# Patient Record
Sex: Female | Born: 2000 | Hispanic: Yes | Marital: Single | Smoking: Never smoker
Health system: Southern US, Community
[De-identification: ages and names within clinical notes are randomized; demographics above are authoritative.]

---
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
## 2021-02-08 ENCOUNTER — Emergency Department: Payer: BLUE CROSS/BLUE SHIELD

## 2021-02-08 ENCOUNTER — Observation Stay
Admission: EM | Admit: 2021-02-08 | Discharge: 2021-02-09 | Disposition: A | Discharge status: Home / Self Care | Payer: BLUE CROSS/BLUE SHIELD | Attending: Student in an Organized Health Care Education/Training Program | Admitting: Student in an Organized Health Care Education/Training Program

## 2021-02-08 ENCOUNTER — Other Ambulatory Visit: Payer: Self-pay

## 2021-02-08 ENCOUNTER — Emergency Department: Payer: BLUE CROSS/BLUE SHIELD | Admitting: Anesthesiology

## 2021-02-08 ENCOUNTER — Encounter
Admission: EM | Disposition: A | Discharge status: Home / Self Care | Payer: Self-pay | Source: Home / Self Care | Attending: Student in an Organized Health Care Education/Training Program

## 2021-02-08 DIAGNOSIS — R1031 Right lower quadrant pain: Secondary | ICD-10-CM

## 2021-02-08 DIAGNOSIS — K3580 Unspecified acute appendicitis: Secondary | ICD-10-CM | POA: Diagnosis not present

## 2021-02-08 DIAGNOSIS — Z20822 Contact with and (suspected) exposure to covid-19: Secondary | ICD-10-CM | POA: Diagnosis not present

## 2021-02-08 DIAGNOSIS — K37 Unspecified appendicitis: Secondary | ICD-10-CM | POA: Diagnosis present

## 2021-02-08 HISTORY — PX: LAPAROSCOPIC APPENDECTOMY: SHX408

## 2021-02-08 LAB — COMPREHENSIVE METABOLIC PANEL
ALT: 13 U/L (ref 0–44)
AST: 16 U/L (ref 15–41)
Albumin: 3.8 g/dL (ref 3.5–5.0)
Alkaline Phosphatase: 63 U/L (ref 38–126)
Anion gap: 7 (ref 5–15)
BUN: 11 mg/dL (ref 6–20)
CO2: 25 mmol/L (ref 22–32)
Calcium: 9.1 mg/dL (ref 8.9–10.3)
Chloride: 105 mmol/L (ref 98–111)
Creatinine, Ser: 0.59 mg/dL (ref 0.44–1.00)
GFR, Estimated: >60 mL/min (ref >60)
Glucose, Bld: 82 mg/dL (ref 70–99)
Potassium: 3.7 mmol/L (ref 3.5–5.1)
Sodium: 137 mmol/L (ref 135–145)
Total Bilirubin: 0.4 mg/dL (ref 0.3–1.2)
Total Protein: 7.3 g/dL (ref 6.5–8.1)

## 2021-02-08 LAB — CBC WITH DIFFERENTIAL/PLATELET
Abs Immature Granulocytes: 0.03 10*3/uL (ref 0.00–0.07)
Basophils Absolute: 0.1 10*3/uL (ref 0.0–0.1)
Basophils Relative: 1 %
Eosinophils Absolute: 0.1 10*3/uL (ref 0.0–0.5)
Eosinophils Relative: 1 %
HCT: 37.9 % (ref 36.0–46.0)
Hemoglobin: 12.2 g/dL (ref 12.0–15.0)
Immature Granulocytes: 0 %
Lymphocytes Relative: 34 %
Lymphs Abs: 3.2 10*3/uL (ref 0.7–4.0)
MCH: 29.6 pg (ref 26.0–34.0)
MCHC: 32.2 g/dL (ref 30.0–36.0)
MCV: 92 fL (ref 80.0–100.0)
Monocytes Absolute: 0.5 10*3/uL (ref 0.1–1.0)
Monocytes Relative: 5 %
Neutro Abs: 5.5 10*3/uL (ref 1.7–7.7)
Neutrophils Relative %: 59 %
Platelets: 265 10*3/uL (ref 150–400)
RBC: 4.12 MIL/uL (ref 3.87–5.11)
RDW: 12.3 % (ref 11.5–15.5)
WBC: 9.3 10*3/uL (ref 4.0–10.5)
nRBC: 0 % (ref 0.0–0.2)

## 2021-02-08 LAB — URINALYSIS, ROUTINE W REFLEX MICROSCOPIC
Bilirubin Urine: NEGATIVE
Glucose, UA: NEGATIVE mg/dL
Ketones, ur: 5 mg/dL — AB
Nitrite: NEGATIVE
Protein, ur: NEGATIVE mg/dL
Specific Gravity, Urine: 1.024 (ref 1.005–1.030)
pH: 5 (ref 5.0–8.0)

## 2021-02-08 LAB — RESP PANEL BY RT-PCR (FLU A&B, COVID) ARPGX2
Influenza A by PCR: NEGATIVE
Influenza B by PCR: NEGATIVE
SARS Coronavirus 2 by RT PCR: NEGATIVE

## 2021-02-08 LAB — LIPASE, BLOOD: Lipase: 28 U/L (ref 11–51)

## 2021-02-08 LAB — PREGNANCY, URINE: Preg Test, Ur: NEGATIVE

## 2021-02-08 SURGERY — APPENDECTOMY, LAPAROSCOPIC
Anesthesia: General | Site: Abdomen

## 2021-02-08 MED ORDER — METRONIDAZOLE 500 MG/100ML IV SOLN
500.0000 mg | Freq: Three times a day (TID) | INTRAVENOUS | Status: DC
  Administered 2021-02-08 – 2021-02-09 (×2): 500 mg via INTRAVENOUS
  Filled 2021-02-08 (×4): qty 100

## 2021-02-08 MED ORDER — MORPHINE SULFATE (PF) 4 MG/ML IV SOLN: 4.0000 mg | INTRAVENOUS | Status: DC | PRN

## 2021-02-08 MED ORDER — PROMETHAZINE HCL 25 MG/ML IJ SOLN: 6.2500 mg | INTRAMUSCULAR | Status: DC | PRN

## 2021-02-08 MED ORDER — ONDANSETRON HCL 4 MG/2ML IJ SOLN
INTRAMUSCULAR | Status: DC | PRN
  Administered 2021-02-08: 4 mg via INTRAVENOUS

## 2021-02-08 MED ORDER — PROCHLORPERAZINE MALEATE 10 MG PO TABS
10.0000 mg | ORAL_TABLET | Freq: Four times a day (QID) | ORAL | Status: DC | PRN
  Filled 2021-02-08: qty 1

## 2021-02-08 MED ORDER — FENTANYL CITRATE (PF) 100 MCG/2ML IJ SOLN: 25.0000 ug | INTRAMUSCULAR | Status: DC | PRN

## 2021-02-08 MED ORDER — SUGAMMADEX SODIUM 200 MG/2ML IV SOLN
INTRAVENOUS | Status: DC | PRN
Start: 2021-02-08 — End: 2021-02-08
  Administered 2021-02-08: 200 mg via INTRAVENOUS

## 2021-02-08 MED ORDER — PANTOPRAZOLE SODIUM 40 MG IV SOLR
40.0000 mg | Freq: Every day | INTRAVENOUS | Status: DC
  Administered 2021-02-08: 40 mg via INTRAVENOUS
  Filled 2021-02-08: qty 40

## 2021-02-08 MED ORDER — PHENYLEPHRINE HCL (PRESSORS) 10 MG/ML IV SOLN
INTRAVENOUS | Status: DC | PRN
  Administered 2021-02-08 (×2): 100 ug via INTRAVENOUS
  Administered 2021-02-08: 200 ug via INTRAVENOUS

## 2021-02-08 MED ORDER — ONDANSETRON 4 MG PO TBDP: 4.0000 mg | ORAL_TABLET | Freq: Four times a day (QID) | ORAL | Status: DC | PRN

## 2021-02-08 MED ORDER — ACETAMINOPHEN 10 MG/ML IV SOLN: 1000.0000 mg | Freq: Once | INTRAVENOUS | Status: DC | PRN

## 2021-02-08 MED ORDER — ONDANSETRON HCL 4 MG/2ML IJ SOLN: 4.0000 mg | Freq: Four times a day (QID) | INTRAMUSCULAR | Status: DC | PRN

## 2021-02-08 MED ORDER — PROPOFOL 10 MG/ML IV BOLUS
INTRAVENOUS | Status: AC
  Filled 2021-02-08: qty 20

## 2021-02-08 MED ORDER — SODIUM CHLORIDE 0.9 % IV SOLN: INTRAVENOUS | Status: DC | PRN

## 2021-02-08 MED ORDER — OXYCODONE HCL 5 MG PO TABS: 5.0000 mg | ORAL_TABLET | Freq: Once | ORAL | Status: DC | PRN

## 2021-02-08 MED ORDER — DROPERIDOL 2.5 MG/ML IJ SOLN
0.6250 mg | Freq: Once | INTRAMUSCULAR | Status: DC | PRN
  Filled 2021-02-08: qty 2

## 2021-02-08 MED ORDER — SODIUM CHLORIDE 0.9 % IV SOLN
1.0000 g | Freq: Once | INTRAVENOUS | Status: AC
  Administered 2021-02-08: 1 g via INTRAVENOUS
  Filled 2021-02-08: qty 10

## 2021-02-08 MED ORDER — ROCURONIUM BROMIDE 100 MG/10ML IV SOLN
INTRAVENOUS | Status: DC | PRN
Start: 2021-02-08 — End: 2021-02-08
  Administered 2021-02-08: 40 mg via INTRAVENOUS

## 2021-02-08 MED ORDER — SUCCINYLCHOLINE CHLORIDE 200 MG/10ML IV SOSY
PREFILLED_SYRINGE | INTRAVENOUS | Status: DC | PRN
  Administered 2021-02-08: 80 mg via INTRAVENOUS

## 2021-02-08 MED ORDER — DIPHENHYDRAMINE HCL 50 MG/ML IJ SOLN: 12.5000 mg | Freq: Four times a day (QID) | INTRAMUSCULAR | Status: DC | PRN

## 2021-02-08 MED ORDER — SODIUM CHLORIDE 0.9 % IV SOLN: INTRAVENOUS | Status: DC

## 2021-02-08 MED ORDER — MIDAZOLAM HCL 2 MG/2ML IJ SOLN
INTRAMUSCULAR | Status: AC
  Filled 2021-02-08: qty 2

## 2021-02-08 MED ORDER — PHENYLEPHRINE HCL (PRESSORS) 10 MG/ML IV SOLN
INTRAVENOUS | Status: AC
  Filled 2021-02-08: qty 1

## 2021-02-08 MED ORDER — ACETAMINOPHEN 500 MG PO TABS
1000.0000 mg | ORAL_TABLET | Freq: Four times a day (QID) | ORAL | Status: DC
  Administered 2021-02-09 (×2): 1000 mg via ORAL
  Filled 2021-02-08 (×2): qty 2

## 2021-02-08 MED ORDER — SODIUM CHLORIDE 0.9 % IV SOLN
2.0000 g | INTRAVENOUS | Status: DC
  Filled 2021-02-08: qty 20

## 2021-02-08 MED ORDER — OXYCODONE HCL 5 MG PO TABS: 5.0000 mg | ORAL_TABLET | ORAL | Status: DC | PRN

## 2021-02-08 MED ORDER — OXYCODONE HCL 5 MG/5ML PO SOLN: 5.0000 mg | Freq: Once | ORAL | Status: DC | PRN

## 2021-02-08 MED ORDER — LIDOCAINE HCL (CARDIAC) PF 100 MG/5ML IV SOSY
PREFILLED_SYRINGE | INTRAVENOUS | Status: DC | PRN
  Administered 2021-02-08: 40 mg via INTRAVENOUS

## 2021-02-08 MED ORDER — MIDAZOLAM HCL 2 MG/2ML IJ SOLN
INTRAMUSCULAR | Status: DC | PRN
  Administered 2021-02-08: 2 mg via INTRAVENOUS

## 2021-02-08 MED ORDER — ACETAMINOPHEN 10 MG/ML IV SOLN
INTRAVENOUS | Status: DC | PRN
  Administered 2021-02-08: 1000 mg via INTRAVENOUS

## 2021-02-08 MED ORDER — KETOROLAC TROMETHAMINE 30 MG/ML IJ SOLN
INTRAMUSCULAR | Status: DC | PRN
  Administered 2021-02-08: 15 mg via INTRAVENOUS

## 2021-02-08 MED ORDER — METRONIDAZOLE 500 MG/100ML IV SOLN
500.0000 mg | Freq: Once | INTRAVENOUS | Status: AC
  Administered 2021-02-08: 500 mg via INTRAVENOUS
  Filled 2021-02-08: qty 100

## 2021-02-08 MED ORDER — FENTANYL CITRATE (PF) 100 MCG/2ML IJ SOLN
INTRAMUSCULAR | Status: DC | PRN
  Administered 2021-02-08: 50 ug via INTRAVENOUS

## 2021-02-08 MED ORDER — PROCHLORPERAZINE EDISYLATE 10 MG/2ML IJ SOLN: 5.0000 mg | Freq: Four times a day (QID) | INTRAMUSCULAR | Status: DC | PRN

## 2021-02-08 MED ORDER — BUPIVACAINE LIPOSOME 1.3 % IJ SUSP
INTRAMUSCULAR | Status: DC | PRN
  Administered 2021-02-08: 20 mL

## 2021-02-08 MED ORDER — IOHEXOL 300 MG/ML  SOLN
100.0000 mL | Freq: Once | INTRAMUSCULAR | Status: AC | PRN
  Administered 2021-02-08: 100 mL via INTRAVENOUS
  Filled 2021-02-08: qty 100

## 2021-02-08 MED ORDER — DEXAMETHASONE SODIUM PHOSPHATE 10 MG/ML IJ SOLN
INTRAMUSCULAR | Status: DC | PRN
  Administered 2021-02-08: 8 mg via INTRAVENOUS

## 2021-02-08 MED ORDER — ONDANSETRON HCL 4 MG/2ML IJ SOLN
INTRAMUSCULAR | Status: AC
  Filled 2021-02-08: qty 2

## 2021-02-08 MED ORDER — 0.9 % SODIUM CHLORIDE (POUR BTL) OPTIME
TOPICAL | Status: DC | PRN
  Administered 2021-02-08: 250 mL

## 2021-02-08 MED ORDER — HEPARIN SODIUM (PORCINE) 5000 UNIT/ML IJ SOLN
5000.0000 [IU] | Freq: Three times a day (TID) | INTRAMUSCULAR | Status: DC
  Administered 2021-02-08 – 2021-02-09 (×2): 5000 [IU] via SUBCUTANEOUS
  Filled 2021-02-08 (×2): qty 1

## 2021-02-08 MED ORDER — BUPIVACAINE LIPOSOME 1.3 % IJ SUSP
INTRAMUSCULAR | Status: AC
  Filled 2021-02-08: qty 20

## 2021-02-08 MED ORDER — DEXAMETHASONE SODIUM PHOSPHATE 10 MG/ML IJ SOLN
INTRAMUSCULAR | Status: AC
  Filled 2021-02-08: qty 1

## 2021-02-08 MED ORDER — SODIUM CHLORIDE 0.9 % IV SOLN: Freq: Once | INTRAVENOUS | Status: AC

## 2021-02-08 MED ORDER — FENTANYL CITRATE (PF) 100 MCG/2ML IJ SOLN
INTRAMUSCULAR | Status: AC
  Filled 2021-02-08: qty 2

## 2021-02-08 MED ORDER — CHLORHEXIDINE GLUCONATE CLOTH 2 % EX PADS
6.0000 | MEDICATED_PAD | Freq: Every day | CUTANEOUS | Status: DC
  Filled 2021-02-08: qty 6

## 2021-02-08 MED ORDER — PROPOFOL 10 MG/ML IV BOLUS
INTRAVENOUS | Status: DC | PRN
  Administered 2021-02-08: 150 mg via INTRAVENOUS

## 2021-02-08 MED ORDER — ACETAMINOPHEN 10 MG/ML IV SOLN
INTRAVENOUS | Status: AC
  Filled 2021-02-08: qty 100

## 2021-02-08 MED ORDER — BUPIVACAINE-EPINEPHRINE (PF) 0.25% -1:200000 IJ SOLN
INTRAMUSCULAR | Status: AC
  Filled 2021-02-08: qty 30

## 2021-02-08 MED ORDER — BUPIVACAINE-EPINEPHRINE 0.25% -1:200000 IJ SOLN
INTRAMUSCULAR | Status: DC | PRN
  Administered 2021-02-08: 30 mL

## 2021-02-08 MED ORDER — DIPHENHYDRAMINE HCL 12.5 MG/5ML PO ELIX
12.5000 mg | ORAL_SOLUTION | Freq: Four times a day (QID) | ORAL | Status: DC | PRN
  Filled 2021-02-08: qty 5

## 2021-02-08 MED ORDER — KETOROLAC TROMETHAMINE 30 MG/ML IJ SOLN
30.0000 mg | Freq: Four times a day (QID) | INTRAMUSCULAR | Status: DC
  Administered 2021-02-09 (×2): 30 mg via INTRAVENOUS
  Filled 2021-02-08 (×2): qty 1

## 2021-02-08 SURGICAL SUPPLY — 40 items
APPLIER CLIP 5 13 M/L LIGAMAX5 (MISCELLANEOUS) ×3
BLADE CLIPPER SURG (BLADE) ×3 IMPLANT
CHLORAPREP W/TINT 26 (MISCELLANEOUS) ×3 IMPLANT
CLIP APPLIE 5 13 M/L LIGAMAX5 (MISCELLANEOUS) ×1 IMPLANT
CUTTER FLEX LINEAR 45M (STAPLE) ×6 IMPLANT
DERMABOND ADVANCED (GAUZE/BANDAGES/DRESSINGS) ×2
DERMABOND ADVANCED .7 DNX12 (GAUZE/BANDAGES/DRESSINGS) ×1 IMPLANT
ELECT CAUTERY BLADE 6.4 (BLADE) ×3 IMPLANT
ELECT REM PT RETURN 9FT ADLT (ELECTROSURGICAL) ×3
ELECTRODE REM PT RTRN 9FT ADLT (ELECTROSURGICAL) ×1 IMPLANT
GAUZE 4X4 16PLY ~~LOC~~+RFID DBL (SPONGE) ×3 IMPLANT
GLOVE SURG ENC MOIS LTX SZ7 (GLOVE) ×3 IMPLANT
GOWN STRL REUS W/ TWL LRG LVL3 (GOWN DISPOSABLE) ×2 IMPLANT
GOWN STRL REUS W/TWL LRG LVL3 (GOWN DISPOSABLE) ×4
IRRIGATION STRYKERFLOW (MISCELLANEOUS) ×1 IMPLANT
IRRIGATOR STRYKERFLOW (MISCELLANEOUS) ×3
IV NS 1000ML (IV SOLUTION) ×2
IV NS 1000ML BAXH (IV SOLUTION) ×1 IMPLANT
MANIFOLD NEPTUNE II (INSTRUMENTS) ×3 IMPLANT
NEEDLE HYPO 22GX1.5 SAFETY (NEEDLE) ×3 IMPLANT
NS IRRIG 500ML POUR BTL (IV SOLUTION) ×3 IMPLANT
PACK LAP CHOLECYSTECTOMY (MISCELLANEOUS) ×3 IMPLANT
PENCIL ELECTRO HAND CTR (MISCELLANEOUS) ×3 IMPLANT
POUCH SPECIMEN RETRIEVAL 10MM (ENDOMECHANICALS) ×3 IMPLANT
RELOAD 45 VASCULAR/THIN (ENDOMECHANICALS) ×3 IMPLANT
RELOAD STAPLE TA45 3.5 REG BLU (ENDOMECHANICALS) ×3 IMPLANT
SCISSORS METZENBAUM CVD 33 (INSTRUMENTS) IMPLANT
SHEARS HARMONIC ACE PLUS 36CM (ENDOMECHANICALS) ×3 IMPLANT
SLEEVE ENDOPATH XCEL 5M (ENDOMECHANICALS) ×3 IMPLANT
SPONGE T-LAP 18X18 ~~LOC~~+RFID (SPONGE) ×3 IMPLANT
STAPLE RELOAD 45 WHT (STAPLE) IMPLANT
STAPLE RELOAD 45MM WHITE (STAPLE)
SUT MNCRL AB 4-0 PS2 18 (SUTURE) ×3 IMPLANT
SUT VICRYL 0 AB UR-6 (SUTURE) ×6 IMPLANT
SYR 20ML LL LF (SYRINGE) ×3 IMPLANT
TRAY FOLEY MTR SLVR 16FR STAT (SET/KITS/TRAYS/PACK) IMPLANT
TROCAR XCEL BLUNT TIP 100MML (ENDOMECHANICALS) ×3 IMPLANT
TROCAR XCEL NON-BLD 5MMX100MML (ENDOMECHANICALS) ×6 IMPLANT
TUBING EVAC SMOKE HEATED PNEUM (TUBING) ×3 IMPLANT
WATER STERILE IRR 500ML POUR (IV SOLUTION) ×3 IMPLANT

## 2021-02-08 NOTE — ED Notes (Signed)
See triage note  presents with pain to RLQ  states pain started yesterday  afebrile on arrival

## 2021-02-08 NOTE — ED Provider Notes (Signed)
[facility] Emergency Department Provider Note    Event Date/Time   First MD Initiated Contact with Patient 02/08/21 1348     (approximate)  I have reviewed the triage vital signs and the nursing notes.   HISTORY  Chief Complaint RLQ pain    HPI Taylor Yoder is a 20 y.o. female no significant past medical history presenting to ER for evaluation of 24 hours of achy throbbing right lower quadrant pain.  No associated chills nausea or vomiting.  Denies any dysuria or hematuria.  No vaginal discharge or bleeding.  She is on any birth control.  Denies any previous surgical history.  No history of ovarian cysts.  Took some Tylenol at home without improvement.  History reviewed. No pertinent past medical history. No family history on file. History reviewed. No pertinent surgical history. There are no problems to display for this patient.     Prior to Admission medications   Not on File    Allergies Penicillins    Social History    Review of Systems Patient denies headaches, rhinorrhea, blurry vision, numbness, shortness of breath, chest pain, edema, cough, abdominal pain, nausea, vomiting, diarrhea, dysuria, fevers, rashes or hallucinations unless otherwise stated above in HPI. ____________________________________________   PHYSICAL EXAM:  VITAL SIGNS: Vitals:   02/08/21 1227 02/08/21 1528  BP: 129/74 120/70  Pulse: (!) 105 100  Resp: 17 16  Temp: 98 F (36.7 C)   SpO2: 97% 97%    Constitutional: Alert and oriented.  Eyes: Conjunctivae are normal.  Head: Atraumatic. Nose: No congestion/rhinnorhea. Mouth/Throat: Mucous membranes are moist.   Neck: No stridor. Painless ROM.  Cardiovascular: Normal rate, regular rhythm. Grossly normal heart sounds.  Good peripheral circulation. Respiratory: Normal respiratory effort.  No retractions. Lungs CTAB. Gastrointestinal: Soft with mild ttp in rlq, no guarding or rebound.  No  distention. No abdominal bruits. No CVA tenderness. Genitourinary:  Musculoskeletal: No lower extremity tenderness nor edema.  No joint effusions. Neurologic:  Normal speech and language. No gross focal neurologic deficits are appreciated. No facial droop Skin:  Skin is warm, dry and intact. No rash noted. Psychiatric: Mood and affect are normal. Speech and behavior are normal.  ____________________________________________   LABS (all labs ordered are listed, but only abnormal results are displayed)  Results for orders placed or performed during the hospital encounter of 02/08/21 (from the past 24 hour(s))  CBC with Differential     Status: None   Collection Time: 02/08/21 12:27 PM  Result Value Ref Range   WBC 9.3 4.0 - 10.5 K/uL   RBC 4.12 3.87 - 5.11 MIL/uL   Hemoglobin 12.2 12.0 - 15.0 g/dL   HCT 17.5 10.2 - 58.5 %   MCV 92.0 80.0 - 100.0 fL   MCH 29.6 26.0 - 34.0 pg   MCHC 32.2 30.0 - 36.0 g/dL   RDW 27.7 82.4 - 23.5 %   Platelets 265 150 - 400 K/uL   nRBC 0.0 0.0 - 0.2 %   Neutrophils Relative % 59 %   Neutro Abs 5.5 1.7 - 7.7 K/uL   Lymphocytes Relative 34 %   Lymphs Abs 3.2 0.7 - 4.0 K/uL   Monocytes Relative 5 %   Monocytes Absolute 0.5 0.1 - 1.0 K/uL   Eosinophils Relative 1 %   Eosinophils Absolute 0.1 0.0 - 0.5 K/uL   Basophils Relative 1 %   Basophils Absolute 0.1 0.0 - 0.1 K/uL   Immature Granulocytes 0 %   Abs  Immature Granulocytes 0.03 0.00 - 0.07 K/uL  Comprehensive metabolic panel     Status: None   Collection Time: 02/08/21 12:27 PM  Result Value Ref Range   Sodium 137 135 - 145 mmol/L   Potassium 3.7 3.5 - 5.1 mmol/L   Chloride 105 98 - 111 mmol/L   CO2 25 22 - 32 mmol/L   Glucose, Bld 82 70 - 99 mg/dL   BUN 11 6 - 20 mg/dL   Creatinine, Ser 0.59 0.44 - 1.00 mg/dL   Calcium 9.1 8.9 - 10.3 mg/dL   Total Protein 7.3 6.5 - 8.1 g/dL   Albumin 3.8 3.5 - 5.0 g/dL   AST 16 15 - 41 U/L   ALT 13 0 - 44 U/L   Alkaline Phosphatase 63 38 - 126 U/L   Total  Bilirubin 0.4 0.3 - 1.2 mg/dL   GFR, Estimated >60 >60 mL/min   Anion gap 7 5 - 15  Lipase, blood     Status: None   Collection Time: 02/08/21 12:27 PM  Result Value Ref Range   Lipase 28 11 - 51 U/L  Urinalysis, Routine w reflex microscopic     Status: Abnormal   Collection Time: 02/08/21 12:40 PM  Result Value Ref Range   Color, Urine YELLOW (A) YELLOW   APPearance CLOUDY (A) CLEAR   Specific Gravity, Urine 1.024 1.005 - 1.030   pH 5.0 5.0 - 8.0   Glucose, UA NEGATIVE NEGATIVE mg/dL   Hgb urine dipstick MODERATE (A) NEGATIVE   Bilirubin Urine NEGATIVE NEGATIVE   Ketones, ur 5 (A) NEGATIVE mg/dL   Protein, ur NEGATIVE NEGATIVE mg/dL   Nitrite NEGATIVE NEGATIVE   Leukocytes,Ua LARGE (A) NEGATIVE   RBC / HPF 6-10 0 - 5 RBC/hpf   WBC, UA 6-10 0 - 5 WBC/hpf   Bacteria, UA RARE (A) NONE SEEN   Squamous Epithelial / LPF 6-10 0 - 5   Mucus PRESENT   Pregnancy, urine     Status: None   Collection Time: 02/08/21 12:40 PM  Result Value Ref Range   Preg Test, Ur NEGATIVE NEGATIVE   ____________________________________________ ____________________________________________  RADIOLOGY  I personally reviewed all radiographic images ordered to evaluate for the above acute complaints and reviewed radiology reports and findings.  These findings were personally discussed with the patient.  Please see medical record for radiology report.  ____________________________________________   PROCEDURES  Procedure(s) performed:  Procedures    Critical Care performed: no ____________________________________________   INITIAL IMPRESSION / ASSESSMENT AND PLAN / ED COURSE  Pertinent labs & imaging results that were available during my care of the patient were reviewed by me and considered in my medical decision making (see chart for details).   DDX: Appendicitis, colitis, torsion, cyst, UTI, stone  Taylor Yoder is a 20 y.o. who presents to the ED with presentation as described  above.  Patient nontoxic-appearing afebrile hemodynamically stable but does have tenderness palpation of right lower quadrant.  Given absence of white count or fever will order ultrasound to evaluate for ovarian pathology.  Patient is declining any pain medication at this time.  Clinical Course as of 02/08/21 1645  Tue Feb 08, 2021  1519 Ultrasound is reassuring on repeat exam patient is still having tenderness to palpation in the right lower quadrant therefore given appendicitis being on the differential will order CT imaging.  Patient agreeable to plan [PR]  1548 My review of CT imaging does appear there is some mild inflammation around the appendix  and suspect this is early acute appendicitis. [PR]  [id number] CT imaging confirms suspicion for acute appendicitis.  Will consult general surgery.  Remains well-appearing she is n.p.o. since noon. [PR]  1644 General surgery plans to take to the OR this evening agrees with IV antibiotics IV fluids.  We will keep n.p.o. [PR]    Clinical Course User Index [PR] Merlyn Lot, MD    The patient was evaluated in Emergency Department today for the symptoms described in the history of present illness. He/she was evaluated in the context of the global COVID-19 pandemic, which necessitated consideration that the patient might be at risk for infection with the SARS-CoV-2 virus that causes COVID-19. Institutional protocols and algorithms that pertain to the evaluation of patients at risk for COVID-19 are in a state of rapid change based on information released by regulatory bodies including the CDC and federal and state organizations. These policies and algorithms were followed during the patient's care in the ED.  As part of my medical decision making, I reviewed the following data within the [place] notes reviewed and incorporated, Labs reviewed, notes from prior ED visits and [place] Controlled Substance  Database   ____________________________________________   FINAL CLINICAL IMPRESSION(S) / ED DIAGNOSES  Final diagnoses:  RLQ abdominal pain  Acute appendicitis, unspecified acute appendicitis type      NEW MEDICATIONS STARTED DURING THIS VISIT:  New Prescriptions   No medications on file     Note:  This document was prepared using Dragon voice recognition software and may include unintentional dictation errors.    Merlyn Lot, MD 02/08/21 [phone]

## 2021-02-08 NOTE — H&P (Signed)
Patient ID: Taylor Yoder, female   DOB: Jul 27, 2000, 20 y.o.   MRN: [id number]  HPI Jamisen Hawes is a 20 y.o. female seen in consultation at the request of Dr. Roxan Hockey for right lower quadrant pain.  Stated that the pain started yesterday located in the right lower quadrant sharp and moderate to severe intensity.  She denies any fevers any chills.  No prior abdominal operations.  CBC was normal as well as CMP.  She did have a CT scan of the abdomen pelvis that I personally reviewed consistent with acute appendicitis no perforation no abscess.  HPI  History reviewed. No pertinent past medical history.  History reviewed. No pertinent surgical history.  No pertinent family history  Social History Patient does not drink and does not smoke  Allergies  Allergen Reactions   Penicillins Other (See Comments)    Current Facility-Administered Medications  Medication Dose Route Frequency Provider Last Rate Last Admin   [START ON 02/09/2021] Chlorhexidine Gluconate Cloth 2 % PADS 6 each  6 each Topical Q0600 Athalie Newhard F, MD       metroNIDAZOLE (FLAGYL) IVPB 500 mg  500 mg Intravenous Once Willy Eddy, MD 100 mL/hr at 02/08/21 1635 500 mg at 02/08/21 1635   No current outpatient medications on file.     Review of Systems Full ROS  was asked and was negative except for the information on the HPI  Physical Exam Blood pressure 120/70, pulse 100, temperature 98 F (36.7 C), resp. rate 16, last menstrual period 01/26/2021, SpO2 97 %. CONSTITUTIONAL: NAD. EYES: Pupils are equal, round, Sclera are non-icteric. EARS, NOSE, MOUTH AND THROAT: She is wearing a mask, Hearing is intact to voice. LYMPH NODES:  Lymph nodes in the neck are normal. RESPIRATORY:  Lungs are clear. There is normal respiratory effort, with equal breath sounds bilaterally, and without pathologic use of accessory muscles. CARDIOVASCULAR: Heart is regular without murmurs, gallops, or rubs. GI: The  abdomen is  soft,Tender to palpation RLQ, + Rovsing and focal peritonitis.There are no palpable masses. There is no hepatosplenomegaly. There are normal bowel sounds in all quadrants. GU: Rectal deferred.   MUSCULOSKELETAL: Normal muscle strength and tone. No cyanosis or edema.   SKIN: Turgor is good and there are no pathologic skin lesions or ulcers. NEUROLOGIC: Motor and sensation is grossly normal. Cranial nerves are grossly intact. PSYCH:  Oriented to person, place and time. Affect is normal.  Data Reviewed  I have personally reviewed the patient's imaging, laboratory findings and medical records.    Assessment/Plan 20 year old female with classic signs and symptoms of acute appendicitis confirmed by CT.  We have started antibiotic therapy IV fluids and we will posterior for laparoscopic appendectomy tonight.  Initially they had hesitation regarding surgical intervention.  After appropriate counseling they understand and agree to proceed Apparently she had something to eat around noon I understand the potential risks of aspiration but the risks of not proceeding are greater than the risks of waiting. Her exam is concerning.  The risks, benefits, complications, treatment options, and expected outcomes were discussed with the patient. The treatment of antibiotics alone was discussed giving a 20% chance that this could fail and surgery would be necessary. 40 % of recurrence within 2 years. Also discussed continuing to the operating room for Laparoscopic Appendectomy.  The possibilities of  bleeding, recurrent infection, perforation of viscus, finding a normal appendix, the need for additional procedures, failure to diagnose a condition, conversion to open procedure and creating  a complication requiring transfusion or further operations were discussed. The patient was given the opportunity to ask questions and have them answered.  Patient would like to proceed with Laparoscopic Appendectomy and  consent was obtained.     Sterling Big, MD FACS General Surgeon 02/08/2021, 5:17 PM

## 2021-02-08 NOTE — ED Provider Notes (Signed)
Emergency Medicine Provider Triage Evaluation Note  Taylor Yoder , a 20 y.o. female  was evaluated in triage.  Pt complains of right lower quadrant pain that started yesterday. No fever, nausea or vomiting.  Review of Systems  Positive: Right lower quadrant pain Negative: Fever, nausea, vomiting  Physical Exam  There were no vitals taken for this visit. Gen:   Awake, no distress  = Resp:  Normal effort  MSK:   Moves extremities without difficulty  Other:    Medical Decision Making  Medically screening exam initiated at 12:24 PM.  Appropriate orders placed.  Taylor Yoder was informed that the remainder of the evaluation will be completed by another provider, this initial triage assessment does not replace that evaluation, and the importance of remaining in the ED until their evaluation is complete.   Taylor Pester, FNP 02/08/21 1229    Gilles Chiquito, MD 02/08/21 [phone]

## 2021-02-08 NOTE — Progress Notes (Signed)
Pt stated that she ate a cheeseburger, fries, and a sprite soda at 12:00 pm today and she stated that she is not in pain at this time. MD and anesthesiology were notified. No new orders. Continue to monitor.

## 2021-02-08 NOTE — Transfer of Care (Signed)
Immediate Anesthesia Transfer of Care Note  Patient: Taylor Yoder  Procedure(s) Performed: APPENDECTOMY LAPAROSCOPIC (Abdomen)  Patient Location: PACU  Anesthesia Type:General  Level of Consciousness: awake  Airway & Oxygen Therapy: Patient Spontanous Breathing and Patient connected to face mask oxygen  Post-op Assessment: Report given to RN and Post -op Vital signs reviewed and stable  Post vital signs: Reviewed and stable  Last Vitals:  Vitals Value Taken Time  BP 116/68   Temp    Pulse 105   Resp 18 02/08/21 1846  SpO2 98   Vitals shown include unvalidated device data.  Last Pain:  Vitals:   02/08/21 1732  TempSrc: Temporal  PainSc: 0-No pain         Complications: No notable events documented.

## 2021-02-08 NOTE — Op Note (Signed)
laparascopic appendectomy   Marilynne Halsted Date of operation:  02/08/2021  Indications: The patient presented with a history of  abdominal pain. Workup has revealed findings consistent with acute appendicitis.  Pre-operative Diagnosis: Acute appendicitis without mention of peritonitis  Post-operative Diagnosis: Same  Surgeon: Sterling Big, MD, FACS  Anesthesia: General with endotracheal tube  Findings: Acute appendicitis not perforated  Estimated Blood Loss: 5c         Specimens: appendix         Complications:  none  Procedure Details  The patient was seen again in the preop area. The options of surgery versus observation were reviewed with the patient and/or family. The risks of bleeding, infection, recurrence of symptoms, negative laparoscopy, potential for an open procedure, bowel injury, abscess or infection, were all reviewed as well. The patient was taken to Operating Room, identified as Taylor Yoder and the procedure verified as laparoscopic appendectomy. A Time Out was held and the above information confirmed.  The patient was placed in the supine position and general anesthesia was induced.  Antibiotic prophylaxis was administered and VT E prophylaxis was in place.  The abdomen was prepped and draped in a sterile fashion. An infraumbilical incision was made. A cutdown technique was used to enter the abdominal cavity. Two vicryl stitches were placed on the fascia and a Hasson trocar inserted. Pneumoperitoneum obtained. Two 5 mm ports were placed under direct visualization.  The appendix was identified and found to be acutely inflamed  The appendix was carefully dissected. The mesoappendix was divided withHarmonic scalpel. The base of the appendix was dissected out and divided with a standard load Endo GIA.The appendix was placed in a Endo Catch bag and removed via the Hasson port. The right lower quadrant and pelvis was then irrigated with  normal saline which  was aspirated. Inspection  failed to identify any additional bleeding and there were no signs of bowel injury. Again the right lower quadrant was inspected there was no sign of bleeding or bowel injury therefore pneumoperitoneum was released, all ports were removed.  The umbilical fascia was closed with 0 Vicryl interrupted sutures and the skin incisions were approximated with subcuticular 4-0 Monocryl. Dermabond was placed The patient tolerated the procedure well, there were no complications. The sponge lap and needle count were correct at the end of the procedure.  The patient was taken to the recovery room in stable condition to be admitted for continued care.    Sterling Big, MD FACS

## 2021-02-08 NOTE — Anesthesia Preprocedure Evaluation (Addendum)
Anesthesia Evaluation  Patient identified by MRN, date of birth, ID band Patient awake    Reviewed: Allergy & Precautions, NPO status , Patient's Chart, lab work & pertinent test results  Airway Mallampati: I  TM Distance: >3 FB Neck ROM: Full    Dental no notable dental hx.    Pulmonary neg pulmonary ROS,    Pulmonary exam normal        Cardiovascular negative cardio ROS Normal cardiovascular exam     Neuro/Psych negative neurological ROS  negative psych ROS   GI/Hepatic Neg liver ROS, acute appendicitis   Endo/Other  negative endocrine ROS  Renal/GU negative Renal ROS  negative genitourinary   Musculoskeletal negative musculoskeletal ROS (+)   Abdominal Normal abdominal exam  (+)   Peds negative pediatric ROS (+)  Hematology negative hematology ROS (+)   Anesthesia Other Findings   Reproductive/Obstetrics negative OB ROS                            Anesthesia Physical Anesthesia Plan  ASA: 2  Anesthesia Plan: General   Post-op Pain Management:    Induction: Intravenous, Rapid sequence and Cricoid pressure planned  PONV Risk Score and Plan: 3 and Ondansetron, Dexamethasone and Midazolam  Airway Management Planned: Oral ETT  Additional Equipment:   Intra-op Plan:   Post-operative Plan: Extubation in OR  Informed Consent: I have reviewed the patients History and Physical, chart, labs and discussed the procedure including the risks, benefits and alternatives for the proposed anesthesia with the patient or authorized representative who has indicated his/her understanding and acceptance.     Dental advisory given  Plan Discussed with: CRNA and Anesthesiologist  Anesthesia Plan Comments: (Dr. Everlene Farrier verbally said this case is emergent. Dr. Everlene Farrier was aware that she ate a meal around noon. He wrote in his note that " I understand the potential risks of aspiration but the risks  of not proceeding are greater than the risks of waiting.")        Anesthesia Quick Evaluation

## 2021-02-08 NOTE — ED Triage Notes (Signed)
Pt comes with c/o RLQ pain since yesterday. Pt denies any N/V/D, fever or chills. Pt denies any urinary symptoms.

## 2021-02-08 NOTE — Anesthesia Procedure Notes (Signed)
Procedure Name: Intubation Date/Time: 02/08/2021 5:53 PM Performed by: Irving Burton, CRNA Pre-anesthesia Checklist: Patient identified, Patient being monitored, Timeout performed, Emergency Drugs available and Suction available Patient Re-evaluated:Patient Re-evaluated prior to induction Oxygen Delivery Method: Circle system utilized Preoxygenation: Pre-oxygenation with 100% oxygen Induction Type: IV induction Ventilation: Mask ventilation without difficulty Laryngoscope Size: 3 and McGraph Grade View: Grade I Tube type: Oral Tube size: 7.0 mm Number of attempts: 1 Airway Equipment and Method: Stylet Placement Confirmation: ETT inserted through vocal cords under direct vision, positive ETCO2 and breath sounds checked- equal and bilateral Secured at: 21 cm Tube secured with: Tape Dental Injury: Teeth and Oropharynx as per pre-operative assessment

## 2021-02-09 ENCOUNTER — Encounter: Payer: Self-pay | Admitting: Surgery

## 2021-02-09 LAB — BASIC METABOLIC PANEL
Anion gap: 6 (ref 5–15)
BUN: 7 mg/dL (ref 6–20)
CO2: 24 mmol/L (ref 22–32)
Calcium: 9.1 mg/dL (ref 8.9–10.3)
Chloride: 106 mmol/L (ref 98–111)
Creatinine, Ser: 0.49 mg/dL (ref 0.44–1.00)
GFR, Estimated: >60 mL/min (ref >60)
Glucose, Bld: 124 mg/dL — ABNORMAL HIGH (ref 70–99)
Potassium: 4 mmol/L (ref 3.5–5.1)
Sodium: 136 mmol/L (ref 135–145)

## 2021-02-09 LAB — CBC
HCT: 35.6 % — ABNORMAL LOW (ref 36.0–46.0)
Hemoglobin: 11.5 g/dL — ABNORMAL LOW (ref 12.0–15.0)
MCH: 30.1 pg (ref 26.0–34.0)
MCHC: 32.3 g/dL (ref 30.0–36.0)
MCV: 93.2 fL (ref 80.0–100.0)
Platelets: 279 10*3/uL (ref 150–400)
RBC: 3.82 MIL/uL — ABNORMAL LOW (ref 3.87–5.11)
RDW: 12.3 % (ref 11.5–15.5)
WBC: 12.5 10*3/uL — ABNORMAL HIGH (ref 4.0–10.5)
nRBC: 0 % (ref 0.0–0.2)

## 2021-02-09 LAB — HIV ANTIBODY (ROUTINE TESTING W REFLEX): HIV Screen 4th Generation wRfx: NONREACTIVE

## 2021-02-09 MED ORDER — OXYCODONE HCL 5 MG PO TABS: 5.0000 mg | ORAL_TABLET | Freq: Four times a day (QID) | ORAL | 0 refills | Status: DC | PRN

## 2021-02-09 MED ORDER — IBUPROFEN 600 MG PO TABS: 600.0000 mg | ORAL_TABLET | Freq: Four times a day (QID) | ORAL | 0 refills | Status: DC | PRN

## 2021-02-09 NOTE — Plan of Care (Signed)
Patient to be discharged home with family.  Discharge instructions to include follow up care, appointments, and medications reviewed with patient who verbalized understanding. PIV removed prior to discharge.

## 2021-02-09 NOTE — Discharge Summary (Signed)
[facility] SURGICAL DISCHARGE SUMMARY  Patient ID: Taylor Yoder MRN: [id number] DOB/AGE: May 27, 2000 20 y.o.  Admit date: 02/08/2021 Discharge date: 02/09/2021  Discharge Diagnoses Patient Active Problem List   Diagnosis Date Noted   Appendicitis 02/08/2021    Consultants None  Procedures 02/08/2021:  Laparoscopic Appendectomy  HPI: Taylor Yoder is a 20 y.o. female seen in consultation at the request of Dr. Roxan Hockey for right lower quadrant pain.  Stated that the pain started yesterday located in the right lower quadrant sharp and moderate to severe intensity.  She denies any fevers any chills.  No prior abdominal operations.  CBC was normal as well as CMP.  She did have a CT scan of the abdomen pelvis that I personally reviewed consistent with acute appendicitis no perforation no abscess.  Hospital Course: Informed consent was obtained and documented, and patient underwent uneventful laparoscopic appendectomy (Dr Everlene Farrier, 02/08/2021).  Post-operatively, patient's pain/symptoms improved/resolved and advancement of patient's diet and ambulation were well-tolerated. The remainder of patient's hospital course was essentially unremarkable, and discharge planning was initiated accordingly with patient safely able to be discharged home with appropriate discharge instructions, pain control, and outpatient follow-up after all of her questions were answered to her expressed satisfaction.   Discharge Condition: Good   Physical Examination:  Constitutional: Well appearing female, NAD Pulmonary: Normal effort, no respiratory distress Gastrointestinal: Soft, incisional soreness, non-distended, no rebound/guarding Skin: Laparoscopic incisions are CDI with dermabond, no erythema or drainage    Allergies as of 02/09/2021       Reactions   Penicillins Other (See Comments)        Medication List     TAKE these medications    acetaminophen 325 MG  tablet Commonly known as: TYLENOL Take 650 mg by mouth every 6 (six) hours as needed.   ibuprofen 600 MG tablet Commonly known as: ADVIL Take 1 tablet (600 mg total) by mouth every 6 (six) hours as needed.   oxyCODONE 5 MG immediate release tablet Commonly known as: Oxy IR/ROXICODONE Take 1 tablet (5 mg total) by mouth every 6 (six) hours as needed for severe pain or breakthrough pain.          Follow-up Information     Donovan Kail, PA-C. Schedule an appointment as soon as possible for a visit in 3 week(s).   Specialty: Physician Assistant Why: Laparoscopic appedectomy Contact information: [address] [place] [address] [phone]                  Time spent on discharge management including discussion of hospital course, clinical condition, outpatient instructions, prescriptions, and follow up with the patient and members of the medical team: >30 minutes  -- Lynden Oxford , PA-C [facility]  02/09/2021, 8:46 AM [phone] M-F: 7am - 4pm

## 2021-02-09 NOTE — Discharge Instructions (Signed)
In addition to included general post-operative instructions,  Diet: Resume home diet.   Activity: No heavy lifting >20 pounds (children, pets, laundry, garbage) or strenuous activity for 4 weeks, but light activity and walking are encouraged. Do not drive or drink alcohol if taking narcotic pain medications or having pain that might distract from driving.  Wound care: 2 days after surgery (11/17), you may shower/get incision wet with soapy water and pat dry (do not rub incisions), but no baths or submerging incision underwater until follow-up.   Medications: Resume all home medications. For mild to moderate pain: acetaminophen (Tylenol) or ibuprofen/naproxen (if no kidney disease). Combining Tylenol with alcohol can substantially increase your risk of causing liver disease. Narcotic pain medications, if prescribed, can be used for severe pain, though may cause nausea, constipation, and drowsiness. Do not combine Tylenol and Percocet (or similar) within a 6 hour period as Percocet (and similar) contain(s) Tylenol. If you do not need the narcotic pain medication, you do not need to fill the prescription.  Call office [phone] / [phone]) at any time if any questions, worsening pain, fevers/chills, bleeding, drainage from incision site, or other concerns.

## 2021-02-09 NOTE — Anesthesia Postprocedure Evaluation (Signed)
Anesthesia Post Note  Patient: Taylor Yoder  Procedure(s) Performed: APPENDECTOMY LAPAROSCOPIC (Abdomen)  Patient location during evaluation: PACU Anesthesia Type: General Level of consciousness: awake and alert Pain management: pain level controlled Vital Signs Assessment: post-procedure vital signs reviewed and stable Respiratory status: spontaneous breathing, nonlabored ventilation and respiratory function stable Cardiovascular status: blood pressure returned to baseline and stable Postop Assessment: no apparent nausea or vomiting Anesthetic complications: no   No notable events documented.   Last Vitals:  Vitals:   02/08/21 2128 02/08/21 2232  BP: 124/79 104/68  Pulse: 98 97  Resp: 18 18  Temp: 37.4 C 36.8 C  SpO2: 100% 98%    Last Pain:  Vitals:   02/08/21 2232  TempSrc: Oral  PainSc:                  Foye Deer

## 2021-02-10 LAB — SURGICAL PATHOLOGY

## 2021-02-28 ENCOUNTER — Encounter: Payer: Self-pay | Admitting: Physician Assistant

## 2021-03-07 ENCOUNTER — Other Ambulatory Visit: Payer: Self-pay

## 2021-03-07 ENCOUNTER — Ambulatory Visit (INDEPENDENT_AMBULATORY_CARE_PROVIDER_SITE_OTHER): Payer: Self-pay | Admitting: Physician Assistant

## 2021-03-07 ENCOUNTER — Encounter: Payer: Self-pay | Admitting: Physician Assistant

## 2021-03-07 VITALS — BP 105/71 | HR 109 | Temp 98.7°F | Ht 62.0 in | Wt 133.0 lb

## 2021-03-07 DIAGNOSIS — Z09 Encounter for follow-up examination after completed treatment for conditions other than malignant neoplasm: Secondary | ICD-10-CM

## 2021-03-07 DIAGNOSIS — K353 Acute appendicitis with localized peritonitis, without perforation or gangrene: Secondary | ICD-10-CM

## 2021-03-07 NOTE — Patient Instructions (Signed)
Please call our office with any questions or concerns. 

## 2021-03-07 NOTE — Progress Notes (Signed)
[facility] POST-OP OFFICE VISIT  03/07/2021  HPI: Taylor Yoder is a 20 y.o. female ~1 month s/p laparoscopic appendectomy for acute appendicitis with Dr Everlene Farrier.   She has done very well Some abdominal soreness for 2-3 days but that resolved quickly No fever, chills, nausea, emesis, or bowel changes  No issues with incisions No other complaints   Vital signs: BP 105/71   Pulse (!) 109   Temp 98.7 F (37.1 C) (Oral)   Ht 5\' 2"  (1.575 m)   Wt 133 lb (60.3 kg)   SpO2 98%   BMI 24.33 kg/m    Physical Exam: Constitutional: Well appearing female, NAD Abdomen: Soft, non-tender, non-distended, no rebound/guarding Skin: Laparoscopic incisions are well healed  Assessment/Plan: This is a 19 y.o. female ~1 month s/p laparoscopic appendectomy for acute appendicitis   - She has completed lifting restrictions  - Reviewed surgical pathology; Acute appendicitis, negative for malignancy   - She can follow up on as needed basis   -- 12, PA-C [facility] 03/07/2021, 11:07 AM [phone] M-F: 7am - 4pm

## 2023-05-28 IMAGING — US US PELVIS COMPLETE
1 series · 15 of 25 positions shown · non-contrast
Comparison: None.

CLINICAL DATA: Right lower quadrant pain for 2 days

EXAM:
TRANSABDOMINAL ULTRASOUND OF PELVIS
DOPPLER ULTRASOUND OF OVARIES
TECHNIQUE: Transabdominal ultrasound examination of the pelvis was performed
including evaluation of the uterus, ovaries, adnexal regions, and
pelvic cul-de-sac.
Color and duplex Doppler ultrasound was utilized to evaluate blood
flow to the ovaries.

[Series 1: us transvaginal non-ob · 15 of 64 slices shown]
[im 1/64]
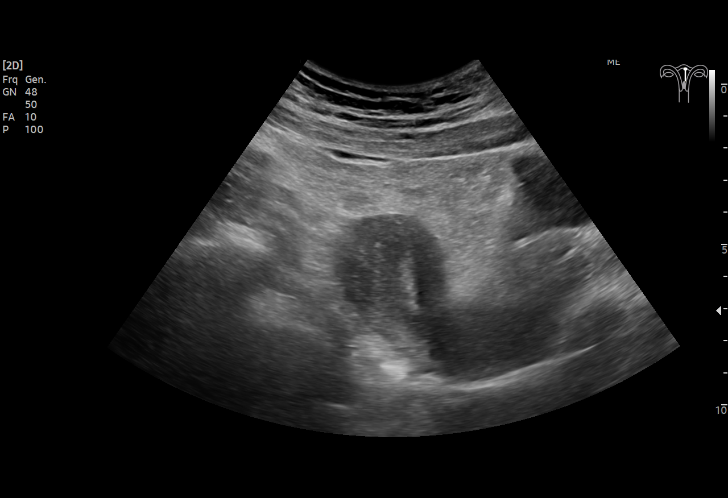
[im 6/64]
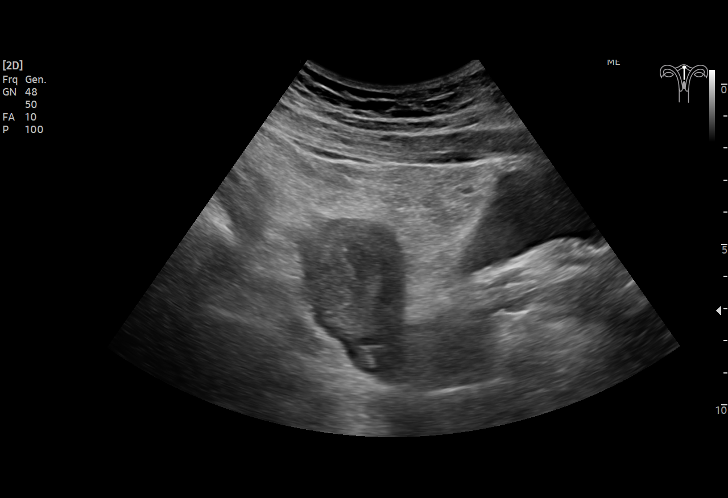
[im 11/64]
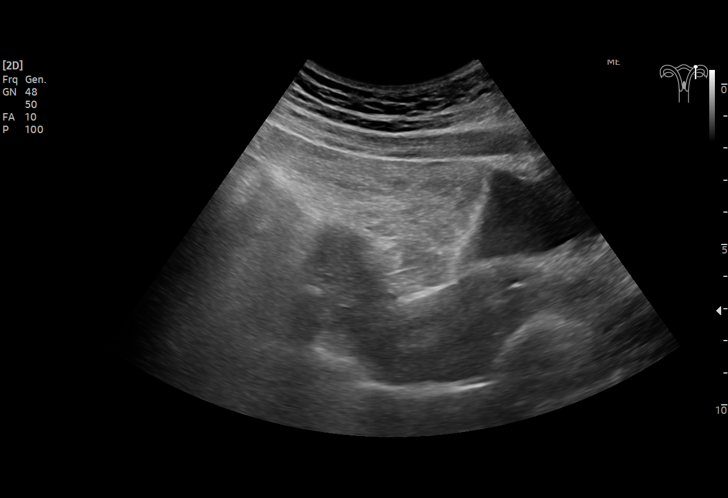
[im 14/64]
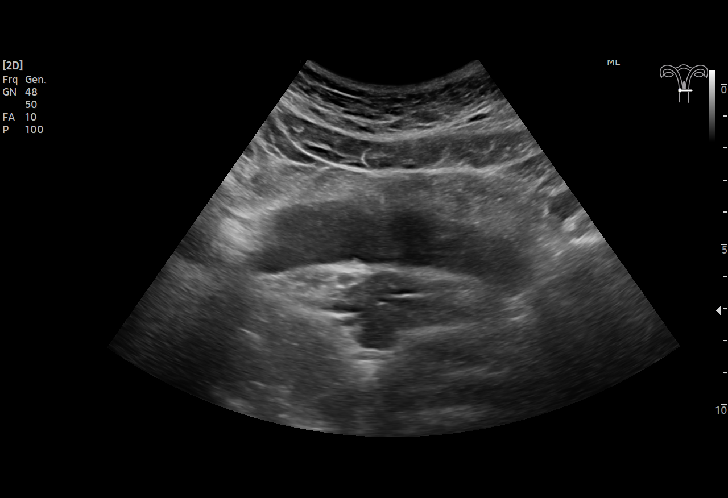
[im 19/64]
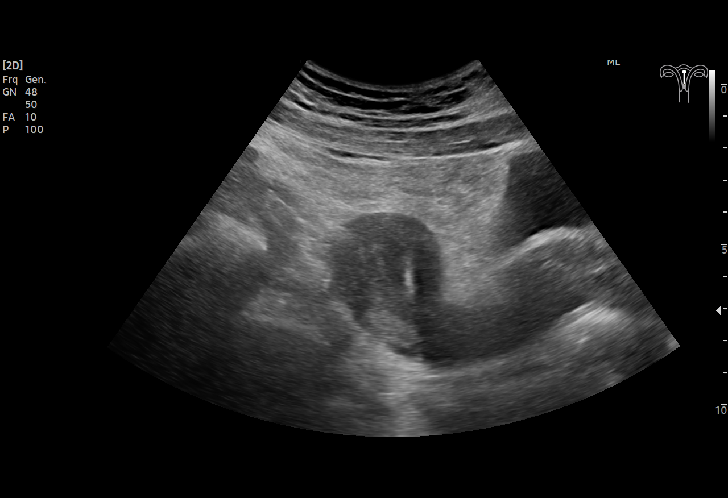
[im 24/64]
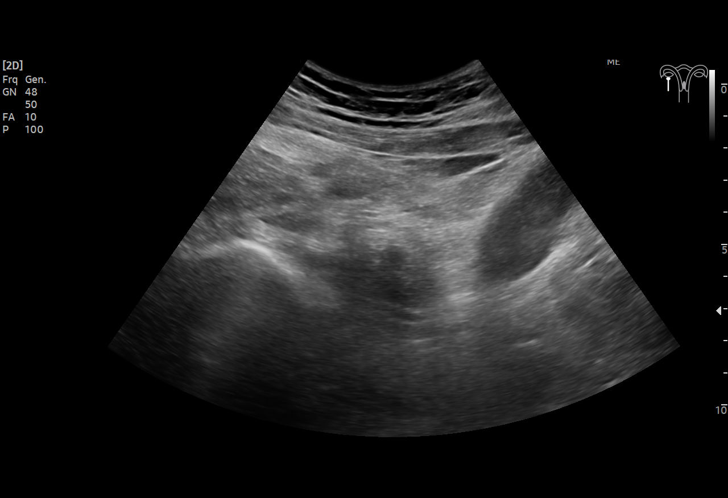
[im 27/64]
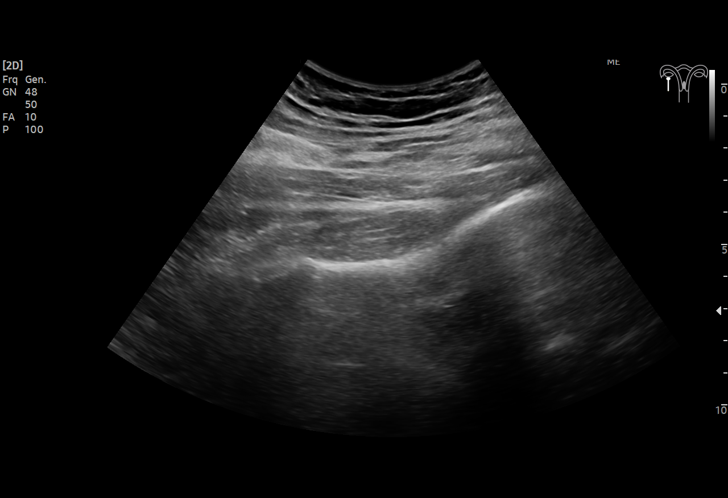
[im 32/64]
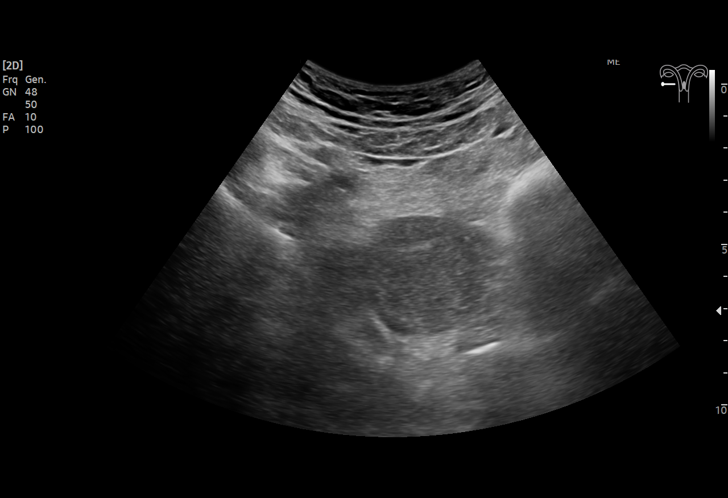
[im 37/64]
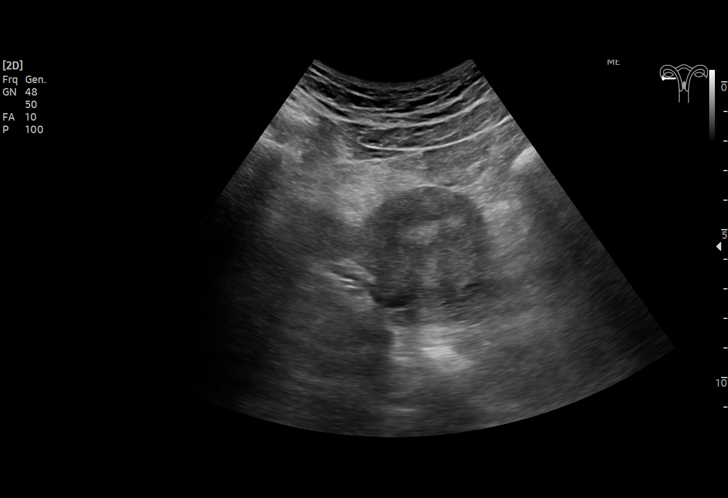
[im 40/64]
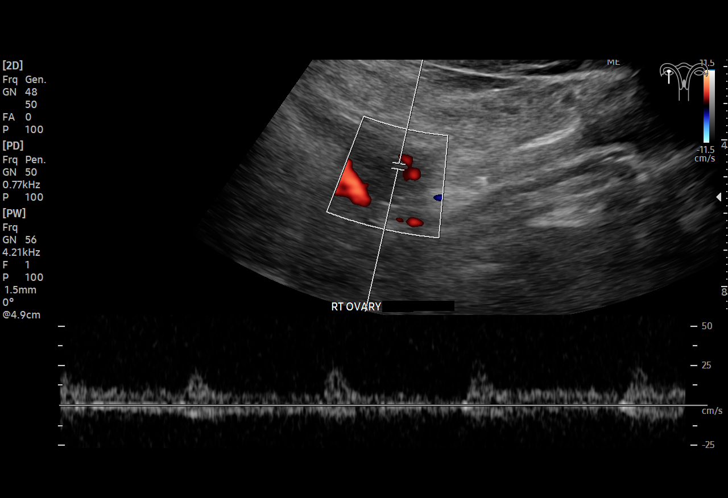
[im 45/64]
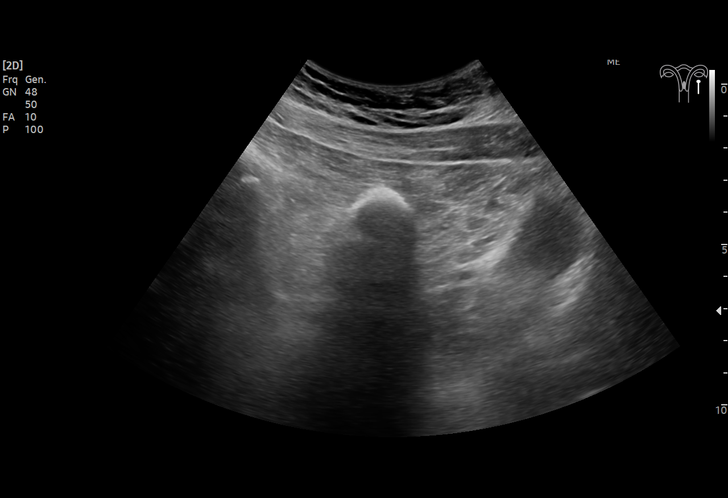
[im 50/64]
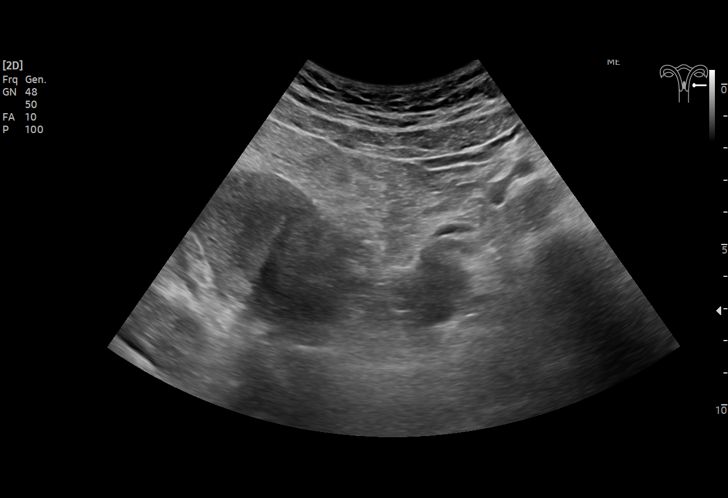
[im 53/64]
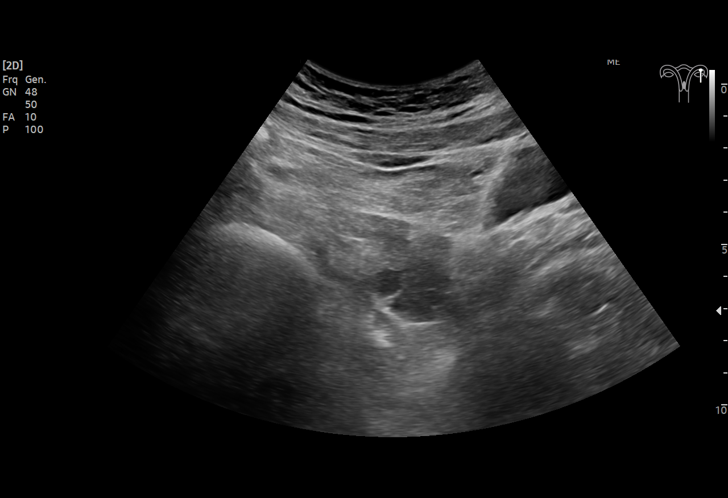
[im 58/64]
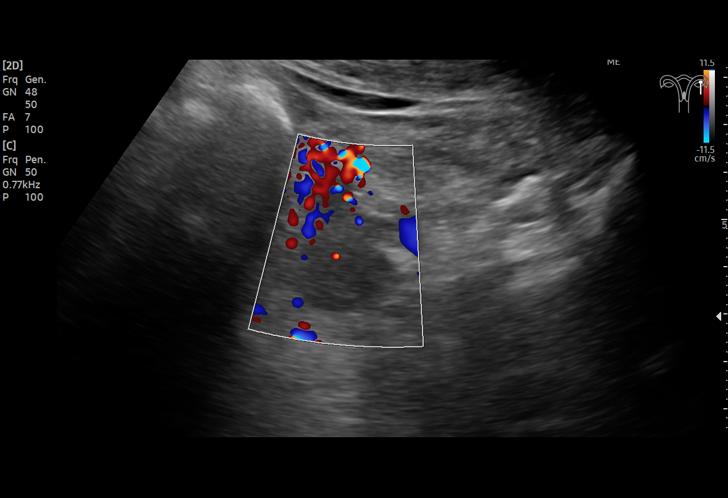
[im 64/64]
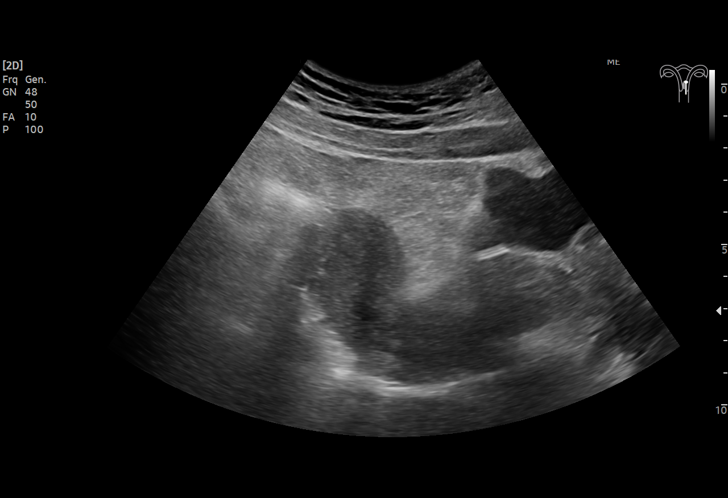

[15 of 25 positions shown; findings below may reference images not displayed]

FINDINGS: Uterus

Measurements: 5.1 cm x 3.2 cm x 4.2 cm = volume: 36 mL. No fibroids
or other mass visualized.

Endometrium

Thickness: 12.  No focal abnormality visualized.

Right ovary

Measurements: 2.7 cm x 2.3 cm x 2.1 cm = volume: 6.8 mL. Normal
appearance/no adnexal mass.

Left ovary

Measurements: 3.2 cm x 2.4 cm x 2.0 cm = volume: 8 mL. Normal
appearance/no adnexal mass.

Pulsed Doppler evaluation demonstrates normal low-resistance
arterial and venous waveforms in both ovaries.

Other: There is no free fluid.
IMPRESSION: Normal pelvic ultrasound.  No evidence of torsion.

## 2023-05-28 IMAGING — CT CT ABD-PELV W/ CM
2 of 4 series · 16 of 46 positions shown, 18 images · IV contrast (APPLIED)
Comparison: None.

CLINICAL DATA: Acute right lower quadrant pain

EXAM:
CT ABDOMEN AND PELVIS WITH CONTRAST
TECHNIQUE: Multidetector CT imaging of the abdomen and pelvis was performed
using the standard protocol following bolus administration of
intravenous contrast.
CONTRAST:  100mL OMNIPAQUE IOHEXOL 300 MG/ML  SOLN

[Series 4: coronal st · coronal · 0.74mm/px · 3 of 81 slices shown]
[im 27/81  soft-tissue]
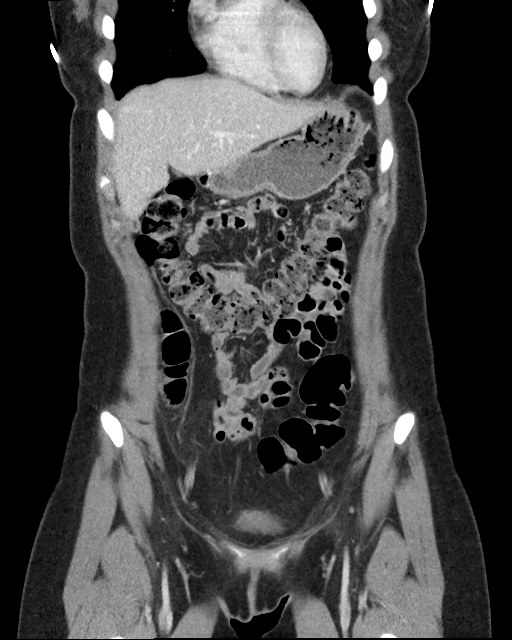
[im 36/81  soft-tissue]
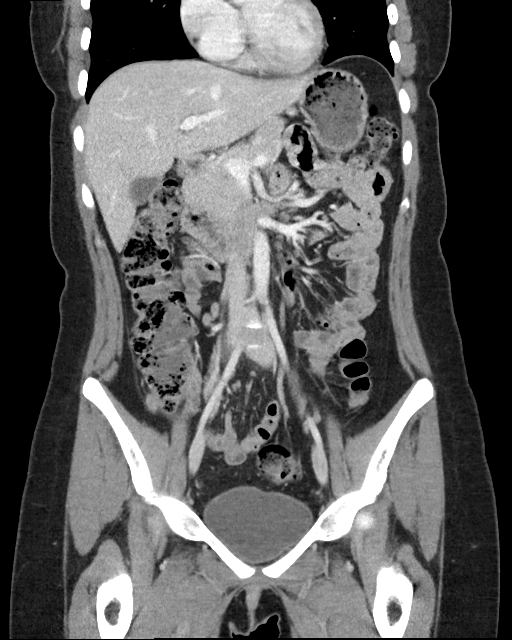
[im 45/81  soft-tissue]
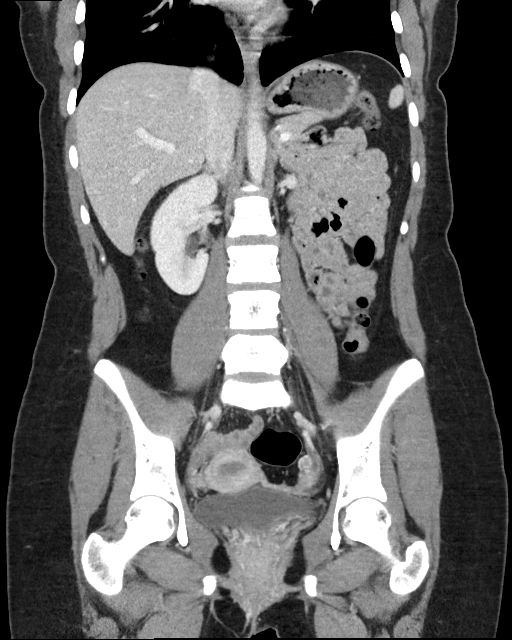

[Series 5: axial st · axial · 0.74mm/px · z∈[-552,-122]mm · 13 of 94 slices shown, 15 images]
[im 4/94  soft-tissue]
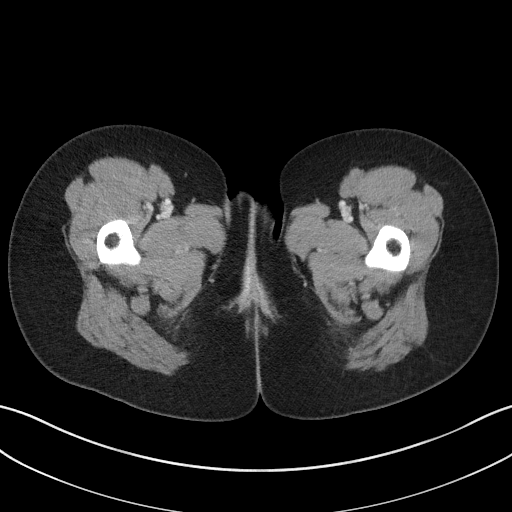
[im 4/94  bone]
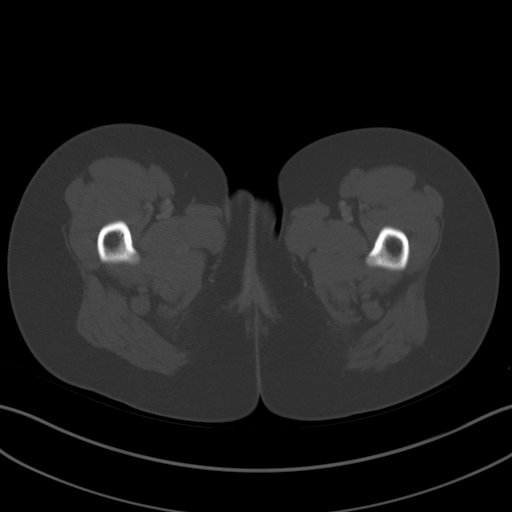
[im 12/94  soft-tissue]
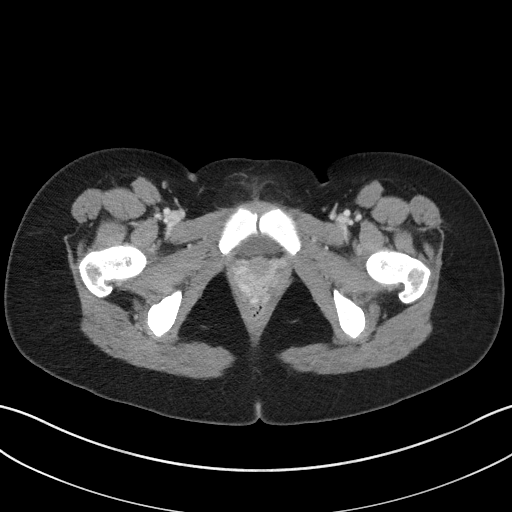
[im 20/94  soft-tissue]
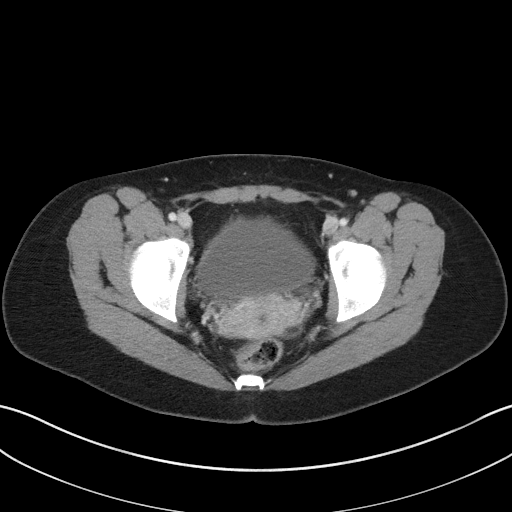
[im 28/94  soft-tissue]
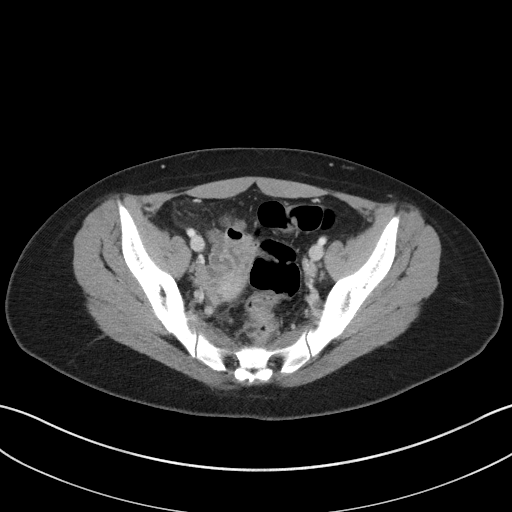
[im 32/94  soft-tissue]
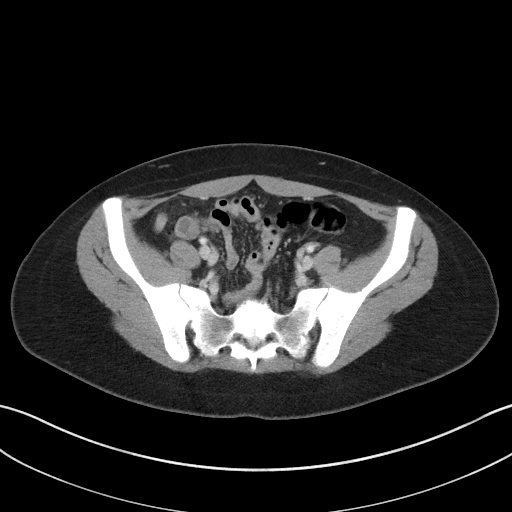
[im 39/94  soft-tissue]
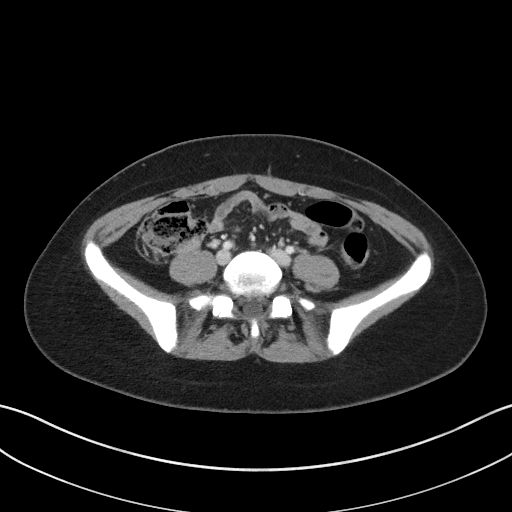
[im 47/94  soft-tissue]
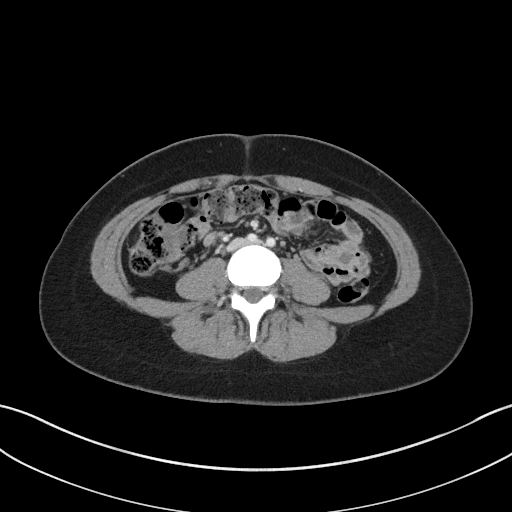
[im 55/94  soft-tissue]
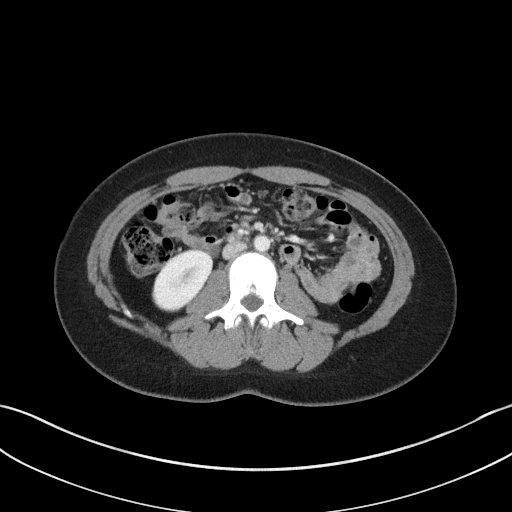
[im 63/94  soft-tissue]
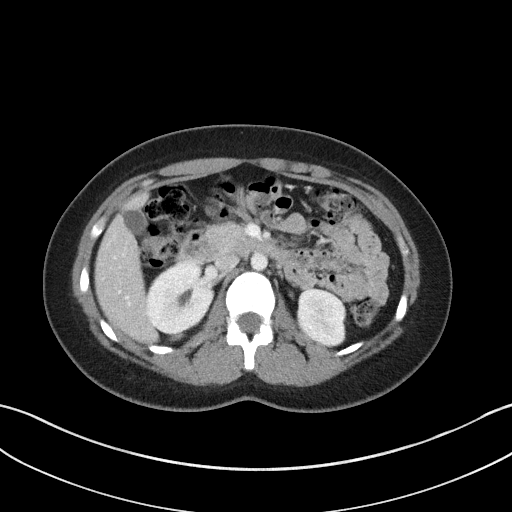
[im 63/94  bone]
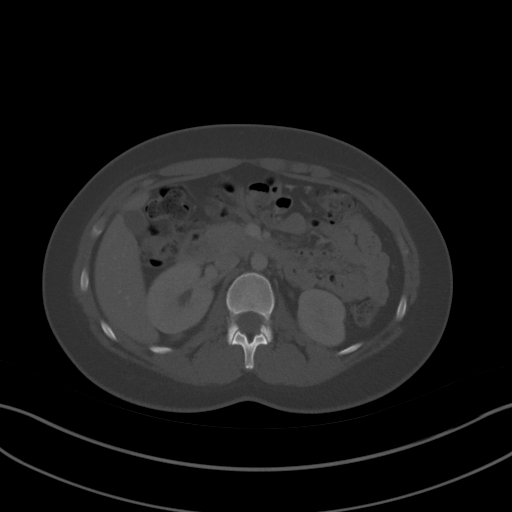
[im 66/94  soft-tissue]
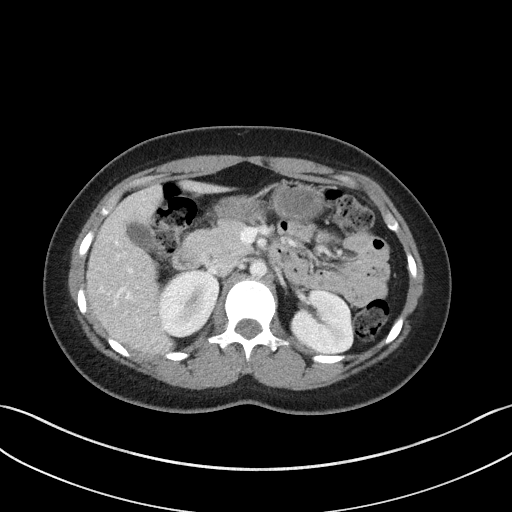
[im 74/94  soft-tissue]
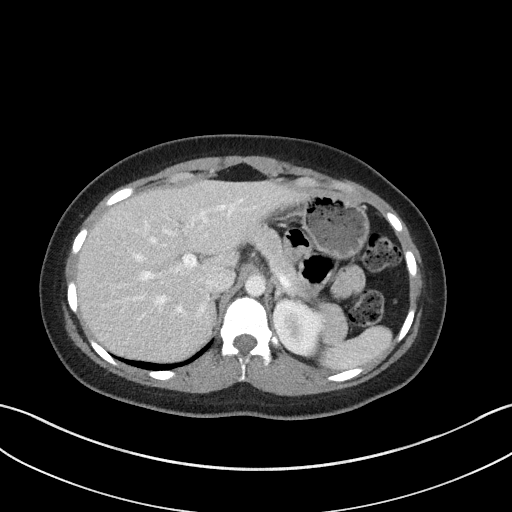
[im 82/94  soft-tissue]
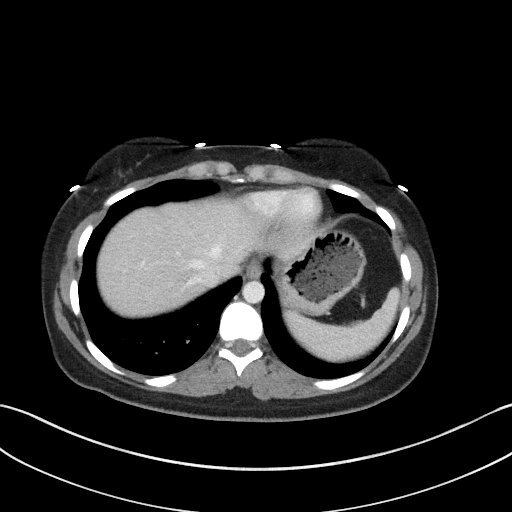
[im 90/94  soft-tissue]
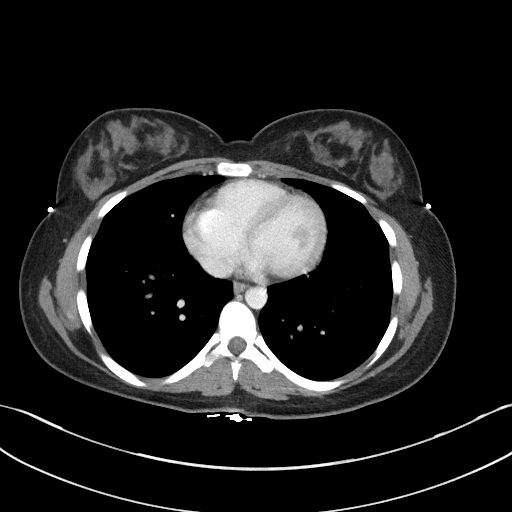

[16 of 46 positions shown; findings below may reference images not displayed]

FINDINGS: Lower chest: The lung bases are clear. The imaged heart is
unremarkable.

Hepatobiliary: The liver and gallbladder are unremarkable. There is
no biliary ductal dilatation.

Pancreas: Unremarkable.

Spleen: Unremarkable.

Adrenals/Urinary Tract: The adrenals are unremarkable.

The kidneys are unremarkable, with no focal lesion, stone,
hydronephrosis, or hydroureter. The bladder is unremarkable.

Stomach/Bowel: The stomach is unremarkable. There is no evidence of
bowel obstruction.

The appendix is prominent in caliber measuring up to 9 mm with mild
wall thickening and hyperemia (6-38). There is minimal surrounding
inflammatory change. No appendicolith is identified. There is no
organized fluid collection or surrounding free air.

Vascular/Lymphatic: The abdominal aorta is normal in course and
caliber. The major branch vessels are patent. The main portal and
splenic veins are patent. There is no abdominal or pelvic
lymphadenopathy.

Reproductive: The uterus and adnexa are unremarkable.

Other: There is no ascites or free air.

Musculoskeletal: There is no acute osseous abnormality or aggressive
osseous lesion.
IMPRESSION: Findings above are suspicious for early/mild acute appendicitis.
Correlate with physical exam.

## 2023-07-04 DIAGNOSIS — R109 Unspecified abdominal pain: Secondary | ICD-10-CM | POA: Diagnosis present

## 2023-07-04 DIAGNOSIS — N83209 Unspecified ovarian cyst, unspecified side: Secondary | ICD-10-CM | POA: Diagnosis not present

## 2023-07-04 NOTE — ED Triage Notes (Addendum)
 Pt coming from home for lower abdominal pain that has been going on for 2 weeks. PCP ordered pt a CT today and pt states the report said she has a ruptured ovarian cyst and wanted to be checked out. Denies N/V/D, endorses syncopal episode on Sunday.

## 2023-07-05 ENCOUNTER — Emergency Department (HOSPITAL_BASED_OUTPATIENT_CLINIC_OR_DEPARTMENT_OTHER)
Admission: EM | Admit: 2023-07-05 | Discharge: 2023-07-05 | Disposition: A | Discharge status: Home / Self Care | Attending: Emergency Medicine | Admitting: Emergency Medicine

## 2023-07-05 DIAGNOSIS — N83209 Unspecified ovarian cyst, unspecified side: Secondary | ICD-10-CM

## 2023-07-05 NOTE — ED Provider Notes (Signed)
 [facility] EMERGENCY DEPARTMENT AT [facility] Provider Note   CSN: [id number] Arrival date & time: 07/04/23  2144     History  Chief Complaint  Patient presents with   Abdominal Pain    Taylor Yoder is a 23 y.o. female.  23 yo F with 2 weeks of abdominal pain and some left flank pain.  She went to see her family doctor for this and an outpatient CT was ordered.  It showed that she had a ruptured ovarian cyst.  She was concerned about the results and so came here to be evaluated.  She actually feels like today it is felt a bit better than it has.  No fevers no vomiting.   Abdominal Pain      Home Medications Prior to Admission medications   Not on File      Allergies    Penicillins    Review of Systems   Review of Systems  Gastrointestinal:  Positive for abdominal pain.    Physical Exam Updated Vital Signs BP 116/77   Pulse 77   Temp 97.9 F (36.6 C)   Resp 18   Ht 5\' 2"  (1.575 m)   Wt 50.1 kg   LMP 07/03/2023 (Exact Date)   SpO2 98%   BMI 20.20 kg/m  Physical Exam Vitals and nursing note reviewed.  Constitutional:      General: She is not in acute distress.    Appearance: She is well-developed. She is not diaphoretic.  HENT:     Head: Normocephalic and atraumatic.  Eyes:     Pupils: Pupils are equal, round, and reactive to light.  Cardiovascular:     Rate and Rhythm: Normal rate and regular rhythm.     Heart sounds: No murmur heard.    No friction rub. No gallop.  Pulmonary:     Effort: Pulmonary effort is normal.     Breath sounds: No wheezing or rales.  Abdominal:     General: There is no distension.     Palpations: Abdomen is soft.     Tenderness: There is no abdominal tenderness.  Musculoskeletal:        General: No tenderness.     Cervical back: Normal range of motion and neck supple.  Skin:    General: Skin is warm and dry.  Neurological:     Mental Status: She is alert and oriented to person, place, and time.   Psychiatric:        Behavior: Behavior normal.     ED Results / Procedures / Treatments   Labs (all labs ordered are listed, but only abnormal results are displayed) Labs Reviewed - No data to display  EKG None  Radiology No results found.  Procedures Procedures    Medications Ordered in ED Medications - No data to display  ED Course/ Medical Decision Making/ A&P                                 Medical Decision Making  23 yo F with a chief complaints of abdominal pain left flank pain.  She had seen her family doctor who would work this up.  She had CT imaging today that showed that she had an ovarian cyst that had ruptured.  The patient was concerned about the results and so came in for evaluation.  I discussed with her the diagnosis.  I answered questions.  Given GYN referral.  3:33 AM:  I have discussed the diagnosis/risks/treatment options with the patient and family.  Evaluation and diagnostic testing in the emergency department does not suggest an emergent condition requiring admission or immediate intervention beyond what has been performed at this time.  They will follow up with PCP, GYN. We also discussed returning to the ED immediately if new or worsening sx occur. We discussed the sx which are most concerning (e.g., sudden worsening pain, fever, inability to tolerate by mouth) that necessitate immediate return. Medications administered to the patient during their visit and any new prescriptions provided to the patient are listed below.  Medications given during this visit Medications - No data to display   The patient appears reasonably screen and/or stabilized for discharge and I doubt any other medical condition or other [facility] requiring further screening, evaluation, or treatment in the ED at this time prior to discharge.          Final Clinical Impression(s) / ED Diagnoses Final diagnoses:  Ovarian cyst rupture    Rx / DC Orders ED Discharge Orders      None         Melene Plan, DO 07/05/23 [phone]

## 2023-07-05 NOTE — Discharge Instructions (Signed)
 Please call OB/GYN clinic to establish care with them.  Please return for worsening pain fever inability eat or drink.  Take 4 over the counter ibuprofen tablets 3 times a day or 2 over-the-counter naproxen tablets twice a day for pain. Also take tylenol 1000mg (2 extra strength) four times a day.

## 2023-07-06 ENCOUNTER — Ambulatory Visit (HOSPITAL_BASED_OUTPATIENT_CLINIC_OR_DEPARTMENT_OTHER): Admitting: Certified Nurse Midwife

## 2023-08-03 ENCOUNTER — Ambulatory Visit (HOSPITAL_BASED_OUTPATIENT_CLINIC_OR_DEPARTMENT_OTHER): Admitting: Family Medicine

## 2023-11-30 DIAGNOSIS — R87612 Low grade squamous intraepithelial lesion on cytologic smear of cervix (LGSIL): Secondary | ICD-10-CM | POA: Diagnosis not present

## 2023-11-30 DIAGNOSIS — N898 Other specified noninflammatory disorders of vagina: Secondary | ICD-10-CM | POA: Diagnosis not present

## 2023-11-30 DIAGNOSIS — N889 Noninflammatory disorder of cervix uteri, unspecified: Secondary | ICD-10-CM | POA: Diagnosis not present

## 2023-11-30 DIAGNOSIS — Z30016 Encounter for initial prescription of transdermal patch hormonal contraceptive device: Secondary | ICD-10-CM | POA: Diagnosis not present

## 2023-11-30 DIAGNOSIS — Z3009 Encounter for other general counseling and advice on contraception: Secondary | ICD-10-CM | POA: Diagnosis not present

## 2024-01-14 DIAGNOSIS — R350 Frequency of micturition: Secondary | ICD-10-CM | POA: Diagnosis not present

## 2024-01-14 DIAGNOSIS — N898 Other specified noninflammatory disorders of vagina: Secondary | ICD-10-CM | POA: Diagnosis not present

## 2024-01-21 ENCOUNTER — Other Ambulatory Visit: Payer: Self-pay

## 2024-01-21 MED ORDER — NORELGESTROMIN-ETH ESTRADIOL 150-35 MCG/24HR TD PTWK
1.0000 | MEDICATED_PATCH | TRANSDERMAL | 1 refills | Status: AC
  Filled 2024-01-21: qty 9, 84d supply, fill #0

## 2024-01-22 ENCOUNTER — Other Ambulatory Visit: Payer: Self-pay

## 2024-02-15 DIAGNOSIS — D649 Anemia, unspecified: Secondary | ICD-10-CM | POA: Diagnosis not present

## 2024-02-15 DIAGNOSIS — R5383 Other fatigue: Secondary | ICD-10-CM | POA: Diagnosis not present

## 2024-02-29 ENCOUNTER — Other Ambulatory Visit: Payer: Self-pay

## 2024-02-29 DIAGNOSIS — Z113 Encounter for screening for infections with a predominantly sexual mode of transmission: Secondary | ICD-10-CM | POA: Diagnosis not present

## 2024-02-29 DIAGNOSIS — Z23 Encounter for immunization: Secondary | ICD-10-CM | POA: Diagnosis not present

## 2024-02-29 DIAGNOSIS — N898 Other specified noninflammatory disorders of vagina: Secondary | ICD-10-CM | POA: Diagnosis not present

## 2024-02-29 DIAGNOSIS — Z01419 Encounter for gynecological examination (general) (routine) without abnormal findings: Secondary | ICD-10-CM | POA: Diagnosis not present

## 2024-02-29 MED ORDER — NORELGESTROMIN-ETH ESTRADIOL 150-35 MCG/24HR TD PTWK: MEDICATED_PATCH | TRANSDERMAL | 3 refills | Status: AC
